# Patient Record
Sex: Female | Born: 1965 | Race: White | Hispanic: No | Smoking: Former smoker
Health system: Southern US, Community
[De-identification: ages and names within clinical notes are randomized; demographics above are authoritative.]

## PROBLEM LIST (undated history)

## (undated) DIAGNOSIS — I10 Essential (primary) hypertension: Secondary | ICD-10-CM

## (undated) DIAGNOSIS — M069 Rheumatoid arthritis, unspecified: Secondary | ICD-10-CM

## (undated) DIAGNOSIS — M1990 Unspecified osteoarthritis, unspecified site: Secondary | ICD-10-CM

## (undated) HISTORY — PX: APPENDECTOMY: SHX54

## (undated) HISTORY — PX: ABDOMINAL HYSTERECTOMY: SHX81

---

## 2008-12-21 ENCOUNTER — Emergency Department (HOSPITAL_COMMUNITY): Admission: EM | Admit: 2008-12-21 | Discharge: 2008-12-21 | Payer: Self-pay | Admitting: Emergency Medicine

## 2010-01-20 ENCOUNTER — Emergency Department (HOSPITAL_COMMUNITY): Admission: EM | Admit: 2010-01-20 | Discharge: 2010-01-20 | Payer: Self-pay | Admitting: Emergency Medicine

## 2016-05-27 ENCOUNTER — Emergency Department (HOSPITAL_COMMUNITY)
Admission: EM | Admit: 2016-05-27 | Discharge: 2016-05-27 | Disposition: A | Discharge status: Home / Self Care | Payer: Self-pay | Attending: Emergency Medicine | Admitting: Emergency Medicine

## 2016-05-27 ENCOUNTER — Encounter (HOSPITAL_COMMUNITY): Payer: Self-pay | Admitting: Emergency Medicine

## 2016-05-27 DIAGNOSIS — Y939 Activity, unspecified: Secondary | ICD-10-CM | POA: Insufficient documentation

## 2016-05-27 DIAGNOSIS — T1592XA Foreign body on external eye, part unspecified, left eye, initial encounter: Secondary | ICD-10-CM

## 2016-05-27 DIAGNOSIS — Y929 Unspecified place or not applicable: Secondary | ICD-10-CM | POA: Insufficient documentation

## 2016-05-27 DIAGNOSIS — Y999 Unspecified external cause status: Secondary | ICD-10-CM | POA: Insufficient documentation

## 2016-05-27 DIAGNOSIS — F17210 Nicotine dependence, cigarettes, uncomplicated: Secondary | ICD-10-CM | POA: Insufficient documentation

## 2016-05-27 DIAGNOSIS — Z79899 Other long term (current) drug therapy: Secondary | ICD-10-CM | POA: Insufficient documentation

## 2016-05-27 DIAGNOSIS — R05 Cough: Secondary | ICD-10-CM | POA: Insufficient documentation

## 2016-05-27 DIAGNOSIS — X58XXXA Exposure to other specified factors, initial encounter: Secondary | ICD-10-CM | POA: Insufficient documentation

## 2016-05-27 DIAGNOSIS — T1502XA Foreign body in cornea, left eye, initial encounter: Secondary | ICD-10-CM | POA: Insufficient documentation

## 2016-05-27 MED ORDER — ERYTHROMYCIN 5 MG/GM OP OINT
TOPICAL_OINTMENT | Freq: Once | OPHTHALMIC | Status: AC
  Administered 2016-05-27: 15:00:00 via OPHTHALMIC
  Filled 2016-05-27: qty 3.5

## 2016-05-27 MED ORDER — TETRACAINE HCL 0.5 % OP SOLN
1.0000 [drp] | Freq: Once | OPHTHALMIC | Status: AC
  Administered 2016-05-27: 1 [drp] via OPHTHALMIC
  Filled 2016-05-27: qty 4

## 2016-05-27 MED ORDER — FLUORESCEIN SODIUM 1 MG OP STRP
1.0000 | ORAL_STRIP | Freq: Once | OPHTHALMIC | Status: AC
  Administered 2016-05-27: 1 via OPHTHALMIC
  Filled 2016-05-27: qty 1

## 2016-05-27 MED ORDER — KETOROLAC TROMETHAMINE 0.5 % OP SOLN
1.0000 [drp] | Freq: Once | OPHTHALMIC | Status: AC
  Administered 2016-05-27: 1 [drp] via OPHTHALMIC
  Filled 2016-05-27: qty 5

## 2016-05-27 NOTE — ED Notes (Signed)
Patient c/o left eye pain. Patient reports using eye drops, eye wash, allergy pills, and benadryl with no relief. Patient states "It pours water and I wake up with my eye crusted." Patient also states that when she blinks it feels like something is scratching her eye.

## 2016-05-27 NOTE — ED Notes (Signed)
Patient verbalizes understanding of discharge instructions at this time. Patient out of department.

## 2016-05-27 NOTE — Discharge Instructions (Signed)
Eye Foreign Body A foreign body refers to any object on the surface of the eye or in the eyeball that should not be there. A foreign body may be a small speck of dirt or dust, a hair or eyelash, a splinter, or any other object.  SIGNS AND SYMPTOMS Symptoms depend on what the foreign body is and where it is in the eye. The most common locations are:   On the inner surface of the upper or lower eyelids or on the covering of the white part of the eye (conjunctiva). Symptoms in this location are:  Pain and irritation, especially when blinking.  The feeling that something is in the eye.  On the surface of the clear covering on the front of the eye (cornea). Symptoms in this location include:  Pain and irritation.   Small "rust rings" around a metallic foreign body.  The feeling that something is in the eye.   Inside the eyeball. Foreign bodies inside the eye may cause:   Great pain.   Immediate loss of vision.   Distortion of the pupil. DIAGNOSIS  Foreign bodies are found during an exam by an eye specialist. Those on the eyelids, conjunctiva, or cornea are usually (but not always) easily found. When a foreign body is inside the eyeball, a cloudiness of the lens (cataract) may form almost right away. This makes it hard for an eye specialist to find the foreign body. Tests may be needed, including ultrasound testing, X-rays, and CT scans. TREATMENT   Foreign bodies on the eyelids, conjunctiva, or cornea are often removed easily and painlessly.  Rust in the cornea may require the use of a drill-like instrument to remove the rust.  If the foreign body has caused a scratch or a rubbing or scraping (abrasion) of the cornea, this may be treated with antibiotic drops or ointment. A pressure patch may be put over your eye.  If the foreign body is inside your eyeball, surgery is needed right away. This is a medical emergency. Foreign bodies inside the eye threaten vision. A person may even  lose his or her eye. HOME CARE INSTRUCTIONS   Take medicines only as directed by your health care provider. Use eye drops or ointment as directed.  If no eye patch was applied:  Keep your eye closed as much as possible.  Do not rub your eye.  Wear dark glasses as needed to protect your eyes from bright light.  Do not wear contact lenses until your eye feels normal again, or as instructed by your health care provider.  Wear a protective eye covering if there is a risk of eye injury. This is important when working with high-speed tools.  If your eye is patched:  Follow your health care provider's instructions for when to remove the patch.  Do notdrive or operate machinery if your eye is patched. Your ability to judge distances is impaired.  Keep all follow-up visits as directed by your health care provider. This is important. SEEK MEDICAL CARE IF:   You have increased pain in your eye.  Your vision gets worse.   You have problems with your eye patch.   You have fluid (discharge) coming from your injured eye.   You have redness and swelling around your affected eye.  MAKE SURE YOU:   Understand these instructions.  Will watch your condition.  Will get help right away if you are not doing well or get worse.   This information is not intended to  replace advice given to you by your health care provider. Make sure you discuss any questions you have with your health care provider.   Document Released: 10/30/2005 Document Revised: 11/20/2014 Document Reviewed: 03/27/2013 Elsevier Interactive Patient Education 2016 [employer]   Apply the antibiotic ointment to your left eye 4 times daily.  Additionally, apply 1 drop of the ketorolac 4 times daily.  I recommend using the drop first then applying the ointment.  Call Dr. Lita MainsHaines office first thing Monday morning for an appointment time for Monday afternoon.

## 2016-05-27 NOTE — ED Provider Notes (Signed)
CSN: [id number]     Arrival date & time 05/27/16  1305 History   First MD Initiated Contact with Patient 05/27/16 1348     Chief Complaint  Patient presents with  . Eye Pain     (Consider location/radiation/quality/duration/timing/severity/associated sxs/prior Treatment) Patient is a 50 y.o. female presenting with eye pain. The history is provided by the patient.  Eye Pain This is a new problem. Episode onset: 7 days ago. The problem occurs constantly. The problem has been unchanged. Associated symptoms include coughing. Pertinent negatives include no abdominal pain, chest pain, chills, congestion, fever, sore throat or visual change. Associated symptoms comments: Clear tearing, no purulent drainage, no surrounding redness, or edema.. Exacerbated by: blinking. Treatments tried: flushing, visine. The treatment provided no relief.    History reviewed. No pertinent past medical history. Past Surgical History  Procedure Laterality Date  . Appendectomy    . Abdominal hysterectomy     History reviewed. No pertinent family history. Social History  Substance Use Topics  . Smoking status: Current Every Day Smoker -- 0.50 packs/day for 15 years    Types: Cigarettes  . Smokeless tobacco: Never Used  . Alcohol Use: No   OB History    Gravida Para Term Preterm AB TAB SAB Ectopic Multiple Living   Review of Systems  Constitutional: Negative for fever and chills.  HENT: Negative for congestion, ear pain, rhinorrhea, sinus pressure, sore throat, trouble swallowing and voice change.   Eyes: Positive for photophobia and pain. Negative for discharge and visual disturbance.  Respiratory: Positive for cough. Negative for shortness of breath, wheezing and stridor.   Cardiovascular: Negative for chest pain.  Gastrointestinal: Negative for abdominal pain.  Genitourinary: Negative.       Allergies  Codeine  Home Medications   Prior to Admission medications   Medication  Sig Start Date End Date Taking? Authorizing Provider  loratadine (CLARITIN) 10 MG tablet Take 10 mg by mouth daily.   Yes Historical Provider, MD  tetrahydrozoline-zinc (VISINE-AC) 0.05-0.25 % ophthalmic solution Place 2 drops into the right eye 3 (three) times daily as needed (red eyes).   Yes Historical Provider, MD   BP 139/87 mmHg  Pulse 76  Temp(Src) 98.1 F (36.7 C) (Oral)  Resp 18  Ht  (1.575 m)  Wt 66.679 kg  BMI 26.88 kg/m2  SpO2 100% Physical Exam  Constitutional: She is oriented to person, place, and time. She appears well-developed and well-nourished.  HENT:  Head: Normocephalic and atraumatic.  Right Ear: Tympanic membrane and ear canal normal.  Left Ear: Tympanic membrane and ear canal normal.  Nose: No mucosal edema or rhinorrhea.  Mouth/Throat: Uvula is midline, oropharynx is clear and moist and mucous membranes are normal. No oropharyngeal exudate, posterior oropharyngeal edema, posterior oropharyngeal erythema or tonsillar abscesses.  Eyes: Pupils are equal, round, and reactive to light. Left eye exhibits no chemosis, no discharge and no exudate. Foreign body present in the left eye. Left conjunctiva is injected. Left eye exhibits normal extraocular motion.  Slit lamp exam:      The left eye shows foreign body and fluorescein uptake.  Pinpoint orange foreign body at 7 oclock position (not shiny like metal would be) of cornea,  Appears embedded.  Cardiovascular: Normal rate.   Pulmonary/Chest: Effort normal.  Musculoskeletal: Normal range of motion.  Neurological: She is alert and oriented to person, place, and time.  Skin: Skin is warm and  dry. No rash noted.  Psychiatric: She has a normal mood and affect.    ED Course  .Foreign Body Removal Date/Time: 05/27/2016 2:26 PM Performed by: Burgess AmorIDOL, Lanny Lipkin Authorized by: Burgess AmorIDOL, Veldon Wager Consent: Verbal consent obtained. Risks and benefits: risks, benefits and alternatives were discussed Consent given by:  patient Patient identity confirmed: verbally with patient Time out: Immediately prior to procedure a "time out" was called to verify the correct patient, procedure, equipment, support staff and site/side marked as required. Body area: eye Location details: left cornea Local anesthetic: properacaine. Patient cooperative: yes Localization method: magnification, slit lamp and visualized Removal mechanism: moist cotton swab Eye examined with fluorescein. Fluorescein uptake. Corneal abrasion size: small Corneal abrasion location: medial Dressing: antibiotic ointment Depth: embedded (yellow/orange appearing fb ? organic debris) Complexity: complex 0 objects recovered. Objects recovered: unable to remove Post-procedure assessment: foreign body not removed   (including critical care time) Labs Review Labs Reviewed - No data to display  Imaging Review No results found. I have personally reviewed and evaluated these images and lab results as part of my medical decision-making.   EKG Interpretation None      MDM   Final diagnoses:  Eye foreign body, left, initial encounter    Pt with left eye fb which has been present for 7 days,  Suspect it is embedded at this point in time.  Unable to remove using moistened q tip.  Will have pt see Dr Lita MainsHaines on Monday. Discussed with him and agrees to see.  Erythromycin ointment, ketorolac gtts.   Burgess AmorJulie Belford Pascucci, PA-C 05/28/16 [id number]  Samuel JesterKathleen McManus, DO 05/28/16 [phone]

## 2018-08-30 ENCOUNTER — Emergency Department (HOSPITAL_COMMUNITY)
Admission: EM | Admit: 2018-08-30 | Discharge: 2018-08-30 | Disposition: A | Discharge status: Home / Self Care | Payer: Medicaid - Out of State | Attending: Emergency Medicine | Admitting: Emergency Medicine

## 2018-08-30 ENCOUNTER — Encounter (HOSPITAL_COMMUNITY): Payer: Self-pay

## 2018-08-30 ENCOUNTER — Emergency Department (HOSPITAL_COMMUNITY): Payer: Medicaid - Out of State

## 2018-08-30 ENCOUNTER — Other Ambulatory Visit: Payer: Self-pay

## 2018-08-30 DIAGNOSIS — Z87891 Personal history of nicotine dependence: Secondary | ICD-10-CM | POA: Insufficient documentation

## 2018-08-30 DIAGNOSIS — S46911A Strain of unspecified muscle, fascia and tendon at shoulder and upper arm level, right arm, initial encounter: Secondary | ICD-10-CM | POA: Diagnosis not present

## 2018-08-30 DIAGNOSIS — M1990 Unspecified osteoarthritis, unspecified site: Secondary | ICD-10-CM

## 2018-08-30 DIAGNOSIS — Y939 Activity, unspecified: Secondary | ICD-10-CM | POA: Diagnosis not present

## 2018-08-30 DIAGNOSIS — Y929 Unspecified place or not applicable: Secondary | ICD-10-CM | POA: Insufficient documentation

## 2018-08-30 DIAGNOSIS — X58XXXA Exposure to other specified factors, initial encounter: Secondary | ICD-10-CM | POA: Insufficient documentation

## 2018-08-30 DIAGNOSIS — S4991XA Unspecified injury of right shoulder and upper arm, initial encounter: Secondary | ICD-10-CM | POA: Diagnosis present

## 2018-08-30 DIAGNOSIS — Z79899 Other long term (current) drug therapy: Secondary | ICD-10-CM | POA: Insufficient documentation

## 2018-08-30 DIAGNOSIS — I10 Essential (primary) hypertension: Secondary | ICD-10-CM | POA: Diagnosis not present

## 2018-08-30 DIAGNOSIS — Y999 Unspecified external cause status: Secondary | ICD-10-CM | POA: Insufficient documentation

## 2018-08-30 HISTORY — DX: Essential (primary) hypertension: I10

## 2018-08-30 HISTORY — DX: Rheumatoid arthritis, unspecified: M06.9

## 2018-08-30 HISTORY — DX: Unspecified osteoarthritis, unspecified site: M19.90

## 2018-08-30 MED ORDER — HYDROCODONE-ACETAMINOPHEN 5-325 MG PO TABS: 1.0000 | ORAL_TABLET | ORAL | 0 refills | Status: AC | PRN

## 2018-08-30 MED ORDER — PREDNISONE 10 MG PO TABS: ORAL_TABLET | ORAL | 0 refills | Status: AC

## 2018-08-30 MED ORDER — PREDNISONE 50 MG PO TABS
60.0000 mg | ORAL_TABLET | Freq: Once | ORAL | Status: AC
  Administered 2018-08-30: 60 mg via ORAL
  Filled 2018-08-30: qty 1

## 2018-08-30 NOTE — Discharge Instructions (Addendum)
Your x-ray does show some mild arthritis, but I suspect you may have strained your shoulder which has caused your acute worsening pain.  Wear the sling for comfort, but did not completely immobilize your shoulder joint is you want to maintain good range of motion and flexibility.  Apply heating pad to your shoulder 20 minutes several times daily.  Use the medications prescribed.  Take your first dose of the prednisone prescription tomorrow as you have received your dose here today.  Do not drive within 4 hours of taking hydrocodone as this medication will make you drowsy.  Plan to see your doctor early next week if your symptoms persist.

## 2018-08-30 NOTE — ED Triage Notes (Signed)
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 Pain worse with movement .

## 2018-08-30 NOTE — ED Notes (Signed)
Pt returned from xray

## 2018-08-30 NOTE — ED Provider Notes (Signed)
[facility] EMERGENCY DEPARTMENT Provider Note   CSN: [id number] Arrival date & time: 08/30/18  7829     History   Chief Complaint Chief Complaint  Patient presents with  . Arm Pain    HPI Jaime Newman is a 52 y.o. 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  She is right-handed and was actively using her arm when the pain began.  She reports her pain feels like a rheumatoid flare which typically will start suddenly.  She denies specific injuries but does lift heavy pots and pans with her job.  Pain is worsened with movement and better at rest.  She has applied heat to the shoulder without significant improvement in symptoms, has also taken ibuprofen without relief.  She denies chest pain or shortness of breath, no fevers or chills.  There is radiation of pain into her elbow area with significant shoulder movements.  Patient a past in [place], awaiting establishing care with a rheumatologist in [place] in February.  The history is provided by the patient.    Past Medical History:  Diagnosis Date  . Arthritis   . Hypertension   . Rheumatoid arthritis (HCC)     There are no active problems to display for this patient.   Past Surgical History:  Procedure Laterality Date  . ABDOMINAL HYSTERECTOMY    . APPENDECTOMY       OB History    Gravida  2   Para  2   Term  2   Preterm      AB      Living  2     SAB      TAB      Ectopic      Multiple      Live Births               Home Medications    Prior to Admission medications   Medication Sig Start Date End Date Taking? Authorizing Provider  HYDROcodone-acetaminophen (NORCO/VICODIN) 5-325 MG tablet Take 1 tablet by mouth every 4 (four) hours as needed. 08/30/18   Burgess Amor, PA-C  loratadine (CLARITIN) 10 MG tablet Take 10 mg by mouth daily.    [provider]  predniSONE (DELTASONE) 10  MG tablet Take 6 tablets day one, 5 tablets day two, 4 tablets day three, 3 tablets day four, 2 tablets day five, then 1 tablet day six 08/31/18   Marycarmen Hagey, Raynelle Fanning, PA-C  tetrahydrozoline-zinc (VISINE-AC) 0.05-0.25 % ophthalmic solution Place 2 drops into the right eye 3 (three) times daily as needed (red eyes).    [provider]    Family History No family history on file.  Social History Social History   Tobacco Use  . Smoking status: Former Smoker    Packs/day: 0.50    Years: 15.00    Pack years: 7.50    Types: Cigarettes    Last attempt to quit: 07/31/2018    Years since quitting: 0.0  . Smokeless tobacco: Never Used  Substance Use Topics  . Alcohol use: No  . Drug use: Not Currently     Allergies   Codeine   Review of Systems Review of Systems  Constitutional: Negative for fever.  Musculoskeletal: Positive for arthralgias. Negative for joint swelling and myalgias.  Neurological: Negative for weakness and numbness.     Physical Exam Updated Vital Signs BP 128/86   Pulse 79   Temp 98.4 F (  36.9 C)   Wt 62.6 kg   SpO2 100%   BMI 25.24 kg/m   Physical Exam  Constitutional: She appears well-developed and well-nourished.  HENT:  Head: Atraumatic.  Neck: Normal range of motion.  Cardiovascular:  Pulses:      Radial pulses are 2+ on the right side, and 2+ on the left side.  Pulses equal bilaterally  Musculoskeletal: She exhibits tenderness. She exhibits no edema or deformity.       Right shoulder: She exhibits bony tenderness and pain. She exhibits no swelling, no effusion, no crepitus, no deformity, normal pulse and normal strength.  Tender to palpation along the lateral humeral head.  There is no palpable deformity.  No tenderness along her biceps or triceps musculature.  Neurological: She is alert. She has normal strength. She displays normal reflexes. No sensory deficit.  Equal grip strength  Skin: Skin is warm and dry.  Psychiatric: She has a  normal mood and affect.     ED Treatments / Results  Labs (all labs ordered are listed, but only abnormal results are displayed) Labs Reviewed - No data to display  EKG None  Radiology Dg Shoulder Right  Result Date: 08/30/2018 CLINICAL DATA:  Right shoulder pain.  No known injury. EXAM: RIGHT SHOULDER - 2+ VIEW COMPARISON:  None. FINDINGS: Degenerative changes in the [facility] joint with joint space narrowing and spurring. Glenohumeral joint is maintained. No acute bony abnormality. Specifically, no fracture, subluxation, or dislocation. Soft tissues are intact. 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 No acute bony abnormality. Electronically Signed   By: Charlett Nose M.D.   On: 08/30/2018 10:06    Procedures Procedures (including critical care time)  Medications Ordered in ED Medications  predniSONE (DELTASONE) tablet 60 mg (has no administration in time range)     Initial Impression / Assessment and Plan / ED Course  I have reviewed the triage vital signs and the nursing notes.  Pertinent labs & imaging results that were available during my care of the patient were reviewed by me and considered in my medical decision making (see chart for details).     Imaging reviewed and discussed with patient.  Suspect she has strained her shoulder from her job.  She was given a sling, but cautioned regarding complete immobilization.  Prednisone, few hydrocodone tablets prescribed.  Follow-up with her PCP for recheck early next week if symptoms persist or worsen.  There is no sign of septic joint, this could be a rheumatoid flare as patient endorses that she has had similar other rheumatoid flares.  If this is the case prednisone may help resolve this episode.  Final Clinical Impressions(s) / ED Diagnoses   Final diagnoses:  Strain of right shoulder, initial encounter  Arthritis    ED Discharge Orders         Ordered    predniSONE (DELTASONE) 10 MG tablet      08/30/18 1028    HYDROcodone-acetaminophen (NORCO/VICODIN) 5-325 MG tablet  Every 4 hours PRN     08/30/18 1028           Burgess Amor, PA-C 08/30/18 1030    Doug Sou, MD 08/30/18 1617

## 2019-06-29 IMAGING — DX DG SHOULDER 2+V*R*
3 series · 3 of 3 positions shown · non-contrast
Comparison: None.

CLINICAL DATA: Right shoulder pain.  No known injury.

EXAM:
RIGHT SHOULDER - 2+ VIEW

[shoulder ap]
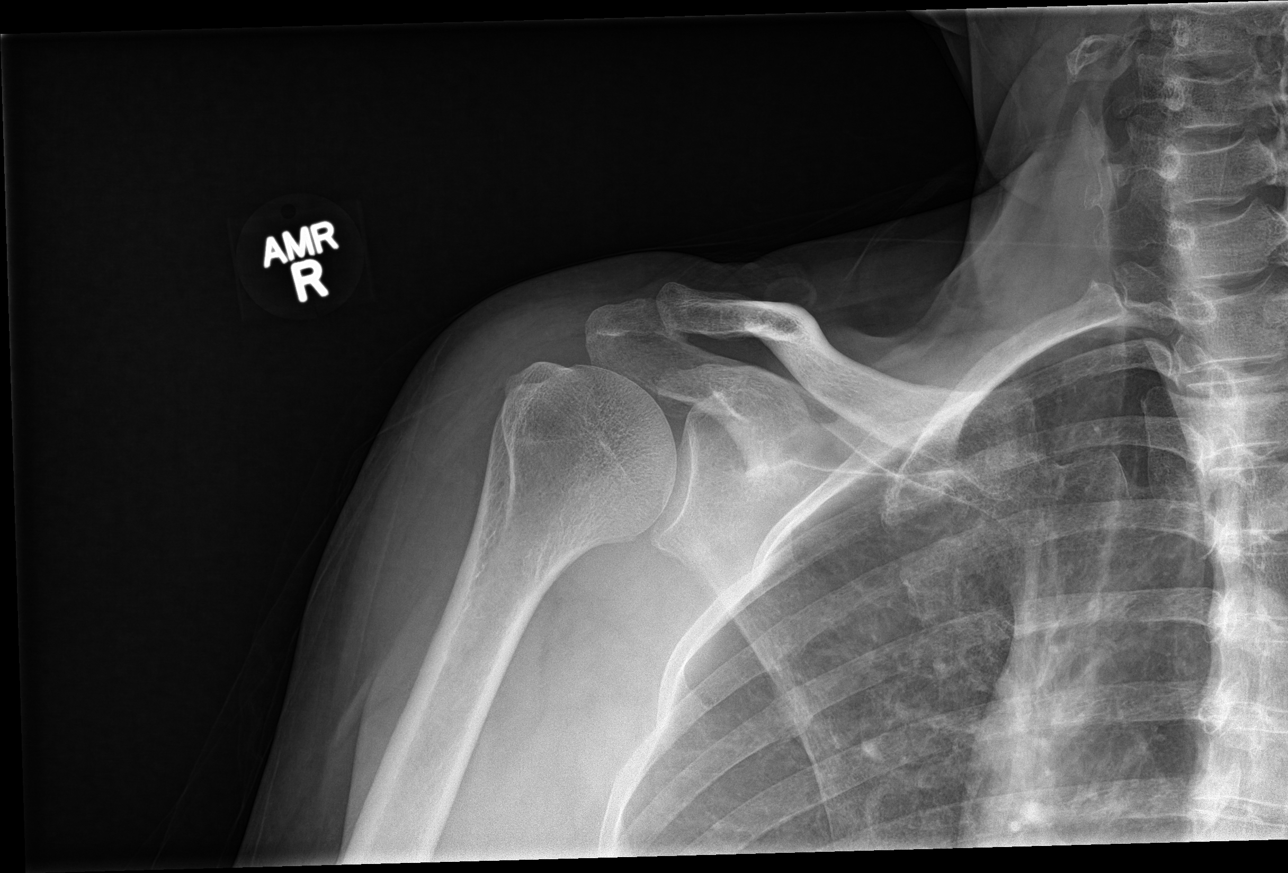

[shoulder y view]
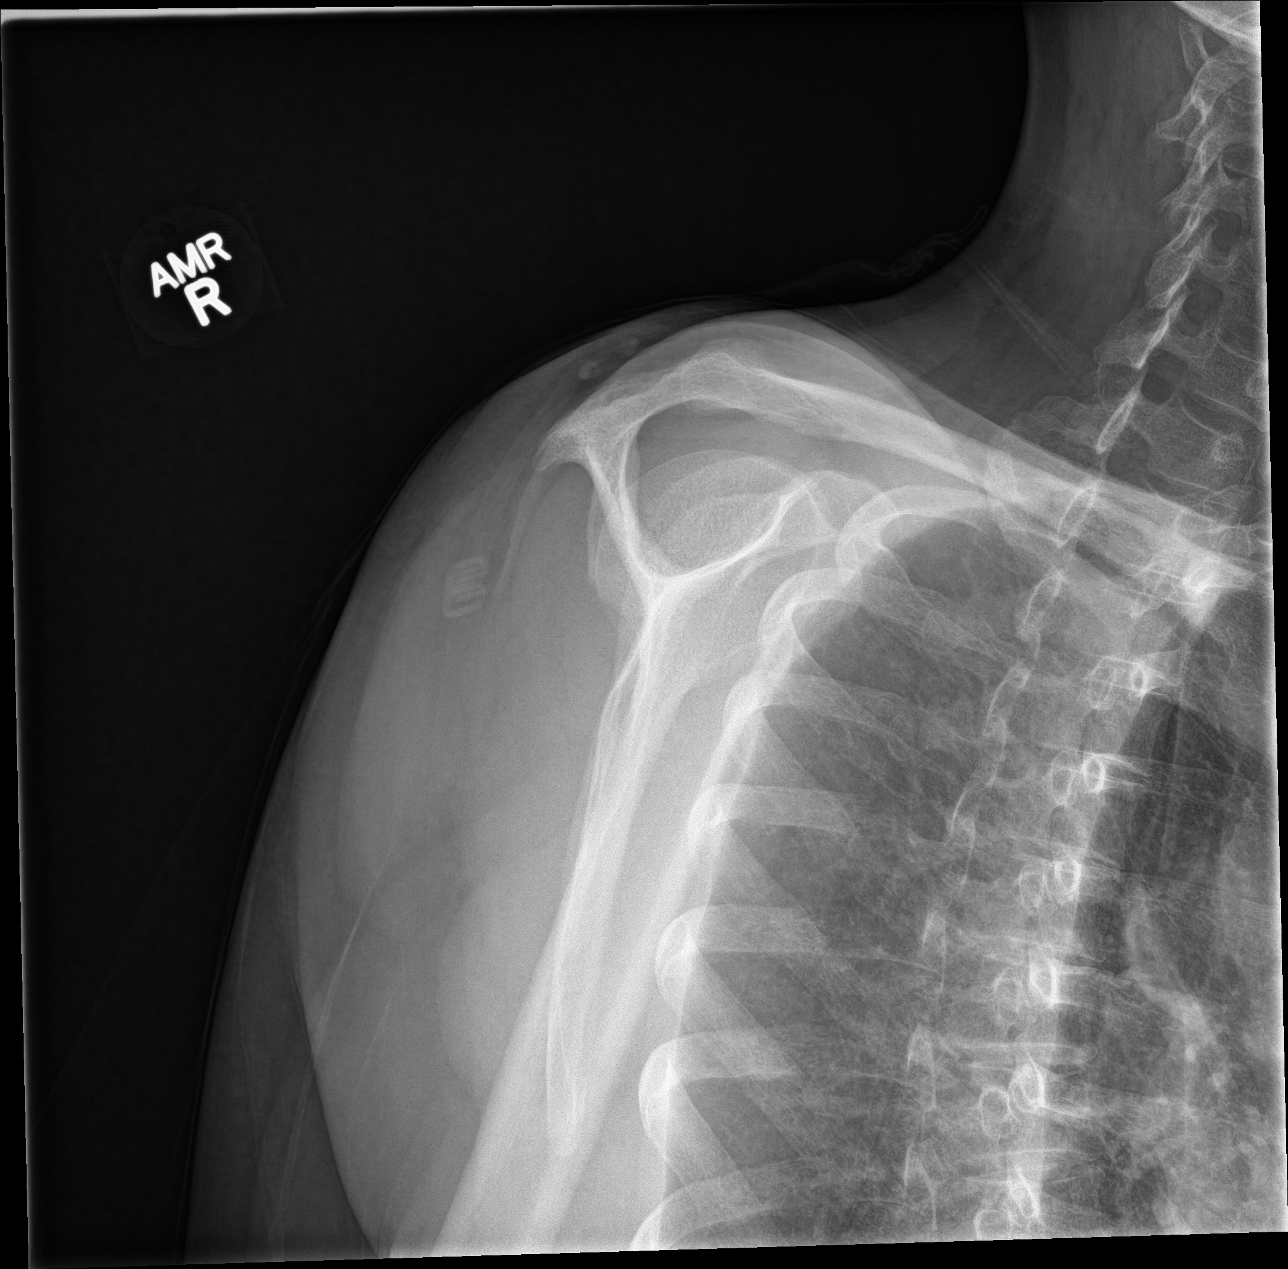

[shoulder axillary]
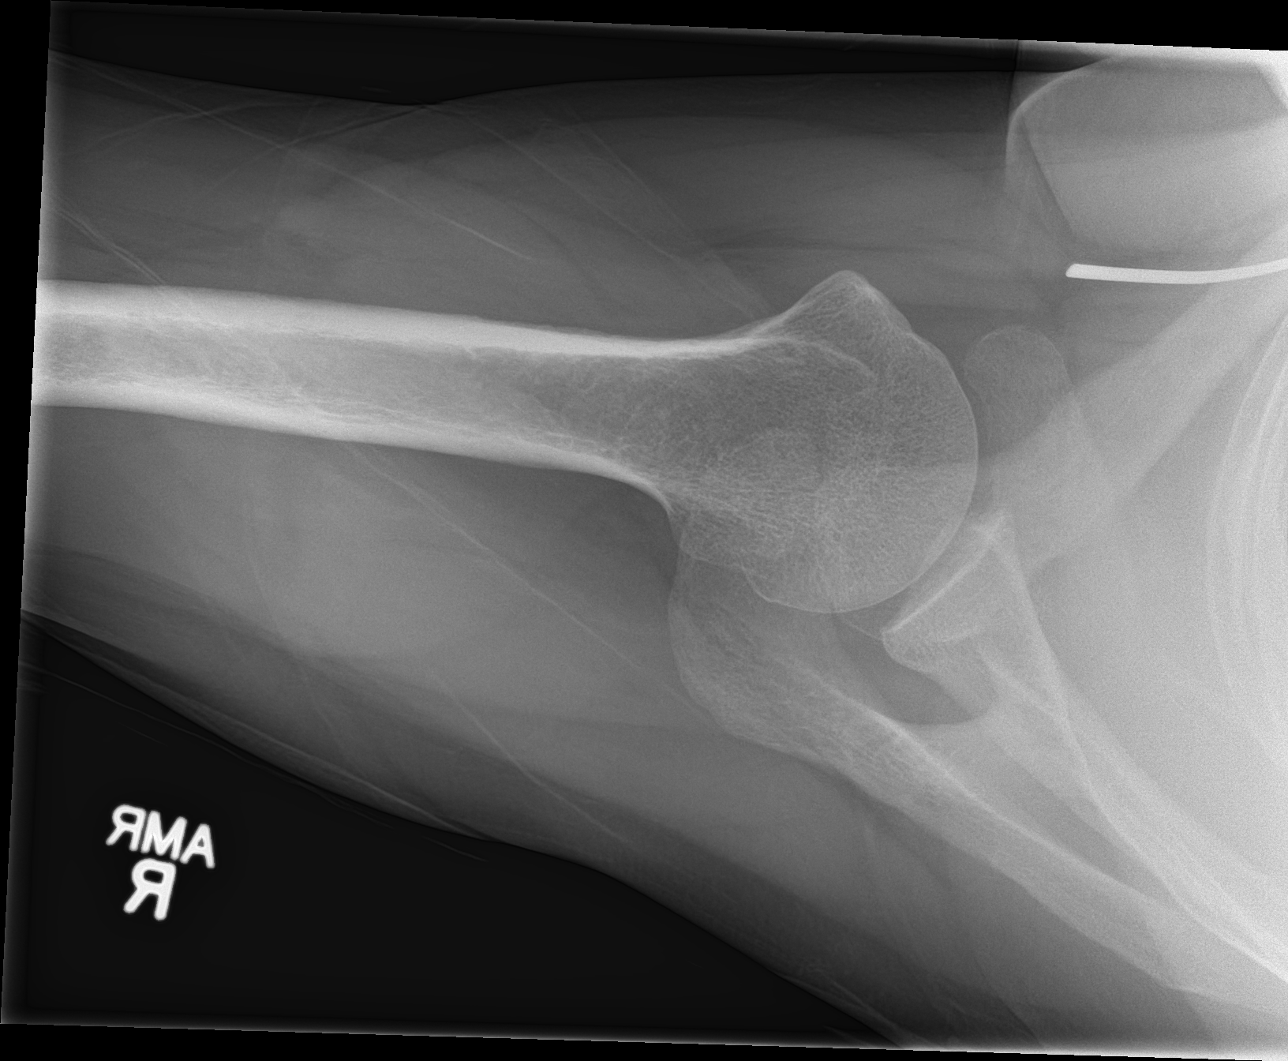

[3 of 3 positions shown; findings below may reference images not displayed]

FINDINGS: Degenerative changes in the AC joint with joint space narrowing and
spurring. Glenohumeral joint is maintained. No acute bony
abnormality. Specifically, no fracture, subluxation, or dislocation.
Soft tissues are intact.
IMPRESSION: Mild degenerative changes in the right AC joint. No acute bony
abnormality.
